# Patient Record
Sex: Female | Born: 2000 | Race: Asian | Hispanic: No | Smoking: Never smoker
Health system: Southern US, Community
[De-identification: ages and names within clinical notes are randomized; demographics above are authoritative.]

## PROBLEM LIST (undated history)

## (undated) DIAGNOSIS — T300 Burn of unspecified body region, unspecified degree: Secondary | ICD-10-CM

## (undated) HISTORY — DX: Burn of unspecified body region, unspecified degree: T30.0

## (undated) HISTORY — PX: BURN DEBRIDEMENT SURGERY: SHX582

---
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
## 2023-07-27 NOTE — Progress Notes (Signed)
 Subjective Patient ID: Melinda Garrison is a 23 y.o. female.  Chief Complaint  Patient presents with  . Abdominal Pain    Patient states she has been experiencing intense abdominal pain for the past 5 hours. Patient denies nausea and vomiting.     The following information was reviewed by members of the visit team:  Tobacco  Allergies  Meds  OB Status      23 year old female presents for evaluation of intense abdominal pain which began approximately 5 hours ago.  Pain is located in mid abdomen and face diffusely.  Reports associated nausea but no vomiting.  Denies associated back or flank pain.  No dysuria, frequency or hematuria.  States she has had intermittent symptoms over the past several weeks.  Symptoms typically resolve without intervention.  Current episode is more intense than previous episodes.  States sometimes symptoms are worse after eating and or exacerbated by food.  At times she will feel burning sensation in her throat as well.  No associated chest pain or discomfort no shortness of breath.  No fever, chills, cough, congestion or other recent illness.  Patient rates pain as 8 or 9 out of 10.    Review of Systems  Constitutional:  Negative for chills and fever.  Respiratory:  Negative for cough and shortness of breath.   Cardiovascular:  Negative for chest pain.  Gastrointestinal:  Positive for abdominal pain and nausea. Negative for constipation, diarrhea and vomiting.  Genitourinary:  Negative for dysuria, flank pain, frequency and hematuria.  Musculoskeletal:  Negative for arthralgias, back pain, myalgias, neck pain and neck stiffness.  Skin:  Negative for rash.  Neurological:  Negative for dizziness and headaches.  Hematological:  Negative for adenopathy. Does not bruise/bleed easily.    Objective Physical Exam Vitals reviewed.  Constitutional:      General: She is not in acute distress.    Appearance: Normal appearance. She is not ill-appearing,  toxic-appearing or diaphoretic.   Cardiovascular:     Rate and Rhythm: Normal rate and regular rhythm.     Pulses: Normal pulses.  Pulmonary:     Effort: Pulmonary effort is normal. No respiratory distress.     Breath sounds: Normal breath sounds.  Abdominal:     General: Abdomen is flat. There is no distension.     Palpations: Abdomen is soft.     Tenderness: There is abdominal tenderness (mild RUQ and epigastrium). There is no right CVA tenderness, left CVA tenderness, guarding or rebound.   Musculoskeletal:     Cervical back: Normal range of motion and neck supple. No rigidity.   Skin:    General: Skin is warm and dry.     Findings: No rash.   Neurological:     Mental Status: She is alert and oriented to person, place, and time.   Psychiatric:        Mood and Affect: Mood normal.     Assessment/Plan 23 year old female with mid and upper abdominal pain for the past 5 hours DDx: Gallbladder disease, pancreatitis, ulcer disease, gastritis, GERD POC urinalysis is negative for infection. POC U pregnant also negative Patient does have mild tenderness to palpation in epigastrium and right upper quadrant. Symptoms and exam seem consistent with GERD/ulcer disease.  However as discussed with patient cannot fully rule out pancreatitis, gallbladder disease or other acute process given limitations of urgent care setting. She appears to be in no acute distress at present, vital signs are within normal limits, afebrile Prescription for Zofran for nausea,  omeprazole for presumed GERD/ulcer disease Recommend follow-up with PCP, patient states she has an appointment in 4 days. Patient also instructed to present to the emergency department for further evaluation should she develop worsening abdominal pain, fever, vomiting or other worsening symptoms. Urgent Care Disposition:  Home Care    Electronically signed: Franky Floria Vannie DEVONNA 07/27/2023  10:32 PM

## 2023-07-31 NOTE — Progress Notes (Signed)
 Patient: Melinda Garrison  DOB: Jul 15, 2000, age 23 y.o. MRN: [id number]  PCP: Lauraine Patient, PA  Assessment and Plan   1. Hashimoto thyroiditis  TSH   TSH    2. Epigastric pain  Hepatic Function Panel   CBC   H. Pylori Breath Test   famotidine (PEPCID) 20 mg tablet   H. Pylori Breath Test   CBC   Hepatic Function Panel      Assessment & Plan 1. Gastritis. - Symptoms suggest a possible gastritis or gallbladder issue. The pain is not associated with her menstrual cycle or hormonal fluctuations. - The most likely diagnosis is gastritis, which can be triggered by stress, spicy food, or other factors. - She has been advised to avoid smoking, caffeinated beverages, spicy foods, and greasy foods, and to consume bland foods instead. - A prescription for Pepcid has been provided. Liver function tests (LFTs) will be conducted to rule out gallbladder involvement. A complete blood count (CBC) will also be performed. An H. pylori test will be administered today. If there is no improvement in her condition, an ultrasound may be considered.  2. Hashimoto's thyroiditis. - She has been on Synthroid since 11/2022. - Synthroid has helped regulate her menstrual cycle, but she does not feel significantly better overall. - She has been informed that she will need to continue taking Synthroid indefinitely. - It is recommended that she takes Synthroid on an empty stomach, separate from other medications.  3. Health maintenance. - She is due for a Pap smear and is advised to schedule it when she feels ready. - Annual physical was completed in 11/2022.  Follow-up - The patient will follow up in 3 months.   Follow up in about 3 months (around 10/31/2023) for nurse visit for labs 5-6 days prior to recheck.   Risks, benefits, and alternatives of the medications and treatment plan prescribed today were discussed, and patient expressed understanding. Answered all questions and addressed all  concerns to the patient's satisfaction.  Plan follow-up as discussed or as needed if any worsening symptoms or change in condition.  Patient voiced understanding of the treatment plan and agreed to attempt to comply.  Designer, fashion/clothing may have been used to create parts of the visit note. Consent from the patient/caregiver was obtained prior to its use.  See after visit summary for patient specific instructions.    Subjective   Melinda Garrison is a 23 y.o. female who presents with:     Patient presents with  . Hypothyroidism due to Hashimoto thyroiditis  . Medication Management     Pt presents to clinic for medication management and abd pain.  5/11 started with abd pain - thought it was a virus but has had intermittent pain since then - sometimes she has to lay and rest for hours  Does not seem to be related to anything. Foods do not seem to affect it, no ETOH since the pain started. Stool has not been right since this started.  During an episode - upper abd then starts to affect lower abd - achy, mild nausea, feels like gurgling in stomach, does not feel like she has to burp more  She has been on zofran and omeprazole 20 - used it once and it did not help  Went to [facility] 6/23  ---------------------------------------------------------------- Computer generated note History of Present Illness The patient presents for a recheck of Hashimoto's thyroiditis and abdominal pain.  She has been experiencing abdominal pain since 06/14/2023, initially attributing it to  food consumption. The pain, described as achy, originates in the upper abdomen and radiates to the lower abdomen. She also reports mild nausea and a gurgling sensation in her stomach. Despite attempts to alleviate the pain through burping, the discomfort persists. She sought treatment at an urgent care facility on 07/27/2023, where she was prescribed Zofran for nausea and omeprazole for her stomach issues. However, these  medications did not provide relief even after an hour of administration. She has not been taking omeprazole consistently over the past four days. She reports that certain foods exacerbate her symptoms, but she has abstained from alcohol due to its potential impact on her condition. Her bowel movements have been irregular, alternating between diarrhea and constipation. She does not consume milk regularly. She last traveled to [place] in 04/2023.  She has been diagnosed with Hashimoto's thyroiditis and has been on Synthroid since 11/2022. She reports a noticeable improvement in her condition, including regular menstrual cycles.  She is sexually active and uses condoms for birth control.  SOCIAL HISTORY She does not smoke or vape. She drinks some alcohol but no drugs. She lives with her parents and sister. She is a Consulting civil engineer at [employer].  FAMILY HISTORY Her mother and father are healthy. She has one brother and one sister, both of whom are healthy.  History was obtained from patient.   Reviewed and updated this visit by provider: Tobacco  Allergies  Meds  Problems  Med Hx  Surg Hx  Fam Hx        Review of Systems  Gastrointestinal:  Positive for abdominal pain (epigastric). Negative for constipation and diarrhea.        Objective   Vitals:   07/31/23 0812  BP: 114/68  Patient Position: Sitting  Pulse: 75  Temp: 97.1 F (36.2 C)  TempSrc: Temporal  Height: 5' 2 (1.575 m)  Weight: 146 lb 9.6 oz (66.5 kg)  SpO2: 98%  BMI (Calculated): 26.8    Physical Exam Vitals and nursing note reviewed.  Constitutional:      Appearance: Normal appearance.  HENT:     Head: Normocephalic and atraumatic.     Right Ear: External ear normal.     Left Ear: External ear normal.     Nose: Nose normal.  Eyes:     Conjunctiva/sclera: Conjunctivae normal.  Cardiovascular:     Rate and Rhythm: Normal rate and regular rhythm.     Pulses: Normal pulses.     Heart  sounds: Normal heart sounds.  Musculoskeletal:     Cervical back: Normal range of motion.  Pulmonary:     Effort: Pulmonary effort is normal.     Breath sounds: Normal breath sounds.  Abdominal:     Tenderness: There is abdominal tenderness (epigastric TTP).  Skin:    General: Skin is warm and dry.  Neurological:     General: No focal deficit present.     Mental Status: She is alert and oriented to person, place, and time. Mental status is at baseline.  Psychiatric:        Mood and Affect: Mood normal.        Behavior: Behavior normal.        Thought Content: Thought content normal.        Judgment: Judgment normal.         Lauraine Allen DEVONNA [facility]: Lambert Rhonda W  07/31/2023

## 2023-08-28 ENCOUNTER — Other Ambulatory Visit: Payer: Self-pay

## 2023-08-28 ENCOUNTER — Emergency Department (HOSPITAL_BASED_OUTPATIENT_CLINIC_OR_DEPARTMENT_OTHER)

## 2023-08-28 ENCOUNTER — Emergency Department (HOSPITAL_BASED_OUTPATIENT_CLINIC_OR_DEPARTMENT_OTHER)
Admission: EM | Admit: 2023-08-28 | Discharge: 2023-08-28 | Disposition: A | Discharge status: Home / Self Care | Attending: Emergency Medicine | Admitting: Emergency Medicine

## 2023-08-28 DIAGNOSIS — R1032 Left lower quadrant pain: Secondary | ICD-10-CM | POA: Diagnosis not present

## 2023-08-28 DIAGNOSIS — R1030 Lower abdominal pain, unspecified: Secondary | ICD-10-CM

## 2023-08-28 DIAGNOSIS — R1031 Right lower quadrant pain: Secondary | ICD-10-CM | POA: Diagnosis present

## 2023-08-28 DIAGNOSIS — R1084 Generalized abdominal pain: Secondary | ICD-10-CM | POA: Diagnosis not present

## 2023-08-28 LAB — CBC WITH DIFFERENTIAL/PLATELET
Abs Immature Granulocytes: 0 K/uL (ref 0.00–0.07)
Basophils Absolute: 0 K/uL (ref 0.0–0.1)
Basophils Relative: 0 %
Eosinophils Absolute: 0.1 K/uL (ref 0.0–0.5)
Eosinophils Relative: 1 %
HCT: 43.9 % (ref 36.0–46.0)
Hemoglobin: 14.8 g/dL (ref 12.0–15.0)
Immature Granulocytes: 0 %
Lymphocytes Relative: 28 %
Lymphs Abs: 1.6 K/uL (ref 0.7–4.0)
MCH: 28.8 pg (ref 26.0–34.0)
MCHC: 33.7 g/dL (ref 30.0–36.0)
MCV: 85.6 fL (ref 80.0–100.0)
Monocytes Absolute: 0.3 K/uL (ref 0.1–1.0)
Monocytes Relative: 6 %
Neutro Abs: 3.9 K/uL (ref 1.7–7.7)
Neutrophils Relative %: 65 %
Platelets: 289 K/uL (ref 150–400)
RBC: 5.13 MIL/uL — ABNORMAL HIGH (ref 3.87–5.11)
RDW: 11.6 % (ref 11.5–15.5)
WBC: 5.9 K/uL (ref 4.0–10.5)
nRBC: 0 % (ref 0.0–0.2)

## 2023-08-28 LAB — URINALYSIS, ROUTINE W REFLEX MICROSCOPIC
Bacteria, UA: NONE SEEN
Bilirubin Urine: NEGATIVE
Glucose, UA: NEGATIVE mg/dL
Ketones, ur: NEGATIVE mg/dL
Leukocytes,Ua: NEGATIVE
Nitrite: NEGATIVE
Protein, ur: NEGATIVE mg/dL
Specific Gravity, Urine: 1.01 (ref 1.005–1.030)
pH: 6 (ref 5.0–8.0)

## 2023-08-28 LAB — COMPREHENSIVE METABOLIC PANEL WITH GFR
ALT: 14 U/L (ref 0–44)
AST: 19 U/L (ref 15–41)
Albumin: 4.6 g/dL (ref 3.5–5.0)
Alkaline Phosphatase: 83 U/L (ref 38–126)
Anion gap: 14 (ref 5–15)
BUN: 12 mg/dL (ref 6–20)
CO2: 22 mmol/L (ref 22–32)
Calcium: 9.6 mg/dL (ref 8.9–10.3)
Chloride: 104 mmol/L (ref 98–111)
Creatinine, Ser: 0.64 mg/dL (ref 0.44–1.00)
GFR, Estimated: >60 mL/min (ref >60)
Glucose, Bld: 141 mg/dL — ABNORMAL HIGH (ref 70–99)
Potassium: 3.6 mmol/L (ref 3.5–5.1)
Sodium: 139 mmol/L (ref 135–145)
Total Bilirubin: 0.3 mg/dL (ref 0.0–1.2)
Total Protein: 7.4 g/dL (ref 6.5–8.1)

## 2023-08-28 LAB — LIPASE, BLOOD: Lipase: 23 U/L (ref 11–51)

## 2023-08-28 LAB — PREGNANCY, URINE: Preg Test, Ur: NEGATIVE

## 2023-08-28 LAB — OCCULT BLOOD X 1 CARD TO LAB, STOOL: Fecal Occult Bld: NEGATIVE

## 2023-08-28 MED ORDER — IOHEXOL 300 MG/ML  SOLN
100.0000 mL | Freq: Once | INTRAMUSCULAR | Status: AC | PRN
  Administered 2023-08-28: 100 mL via INTRAVENOUS
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
## 2023-08-28 NOTE — ED Notes (Signed)
 Patient transported to CT

## 2023-08-28 NOTE — ED Provider Notes (Signed)
 [facility] Provider Note   CSN: [id number] Arrival date & time: 08/28/23  1309     Patient presents with: Abdominal Pain   Melinda Garrison is a 23 y.o. female.   Abdominal Pain  Patient is a 23 year old female presents the ED today for concerns for generalized abdominal pain that localizes to her lower abdomen that started last night acutely noting that she had also had 1 episode of black stools.  Previous history of H. pylori +20-month ago and finished tetracycline/bismuth/Protonix/metronidazole 2 weeks ago.  Reported that black stools while she was taking the medications were common but that they had stopped after finishing medication course but returning for 1 episode last night. Endorses diarrhea that has been persistent since she has started the antibiotics.  Notes that her last menstrual period ended 2 days ago.  Denies fever, headache, vision changes, chest pain, shortness of breath, nausea, vomiting, hematochezia, hematuria, dysuria, vaginal discharge, vaginal pain, lower leg swelling.  Denies alcohol use, chronic NSAID use, IVD use, drug use.    Prior to Admission medications   Not on File    Allergies: Patient has no known allergies.    Review of Systems  Gastrointestinal:  Positive for abdominal pain.    Updated Vital Signs BP 114/67   Pulse 73   Temp 98.6 F (37 C) (Oral)   Resp 14   SpO2 98%   Physical Exam Vitals and nursing note reviewed.  Constitutional:      General: She is not in acute distress.    Appearance: Normal appearance. She is not ill-appearing or diaphoretic.  HENT:     Head: Normocephalic and atraumatic.  Eyes:     General: No scleral icterus.       Right eye: No discharge.        Left eye: No discharge.     Extraocular Movements: Extraocular movements intact.     Conjunctiva/sclera: Conjunctivae normal.     Pupils: Pupils are equal, round, and reactive to light.  Cardiovascular:      Rate and Rhythm: Normal rate and regular rhythm.     Pulses: Normal pulses.     Heart sounds: Normal heart sounds. No murmur heard.    No friction rub. No gallop.  Pulmonary:     Effort: Pulmonary effort is normal. No respiratory distress.     Breath sounds: Normal breath sounds. No stridor. No wheezing, rhonchi or rales.  Chest:     Chest wall: No tenderness.  Abdominal:     General: Abdomen is flat.     Palpations: Abdomen is soft. There is no pulsatile mass.     Tenderness: There is abdominal tenderness in the right lower quadrant, suprapubic area and left lower quadrant. There is no right CVA tenderness, left CVA tenderness, guarding or rebound. Negative signs include Murphy's sign, Rovsing's sign and McBurney's sign.     Hernia: No hernia is present.  Musculoskeletal:     Cervical back: Normal range of motion and neck supple. No rigidity or tenderness.     Right lower leg: No edema.     Left lower leg: No edema.  Skin:    General: Skin is warm and dry.     Coloration: Skin is not jaundiced.     Findings: No bruising, erythema, lesion or rash.  Neurological:     General: No focal deficit present.     Mental Status: She is alert and oriented to person, place, and time. Mental status  is at baseline.     Sensory: No sensory deficit.     Motor: No weakness.     Gait: Gait normal.  Psychiatric:        Mood and Affect: Mood normal.     (all labs ordered are listed, but only abnormal results are displayed) Labs Reviewed  CBC WITH DIFFERENTIAL/PLATELET - Abnormal; Notable for the following components:      Result Value   RBC 5.13 (*)    All other components within normal limits  COMPREHENSIVE METABOLIC PANEL WITH GFR - Abnormal; Notable for the following components:   Glucose, Bld 141 (*)    All other components within normal limits  URINALYSIS, ROUTINE W REFLEX MICROSCOPIC - Abnormal; Notable for the following components:   Hgb urine dipstick SMALL (*)    All other  components within normal limits  LIPASE, BLOOD  OCCULT BLOOD X 1 CARD TO LAB, STOOL  PREGNANCY, URINE    EKG: None  Radiology: CT ABDOMEN PELVIS W CONTRAST Result Date: 08/28/2023 CLINICAL DATA:  LLQ abdominal pain.  RLQ abdominal pain. EXAM: CT ABDOMEN AND PELVIS WITH CONTRAST TECHNIQUE: Multidetector CT imaging of the abdomen and pelvis was performed using the standard protocol following bolus administration of intravenous contrast. RADIATION DOSE REDUCTION: This exam was performed according to the departmental dose-optimization program which includes automated exposure control, adjustment of the mA and/or kV according to patient size and/or use of iterative reconstruction technique. CONTRAST:  100mL OMNIPAQUE  IOHEXOL  300 MG/ML  SOLN COMPARISON:  None Available. FINDINGS: Lower chest: The lung bases are clear. No pleural effusion. The heart is normal in size. No pericardial effusion. Hepatobiliary: The liver is normal in size. Non-cirrhotic configuration. No suspicious mass. No intrahepatic or extrahepatic bile duct dilation. No calcified gallstones. Normal gallbladder wall thickness. No pericholecystic inflammatory changes. Pancreas: Unremarkable. No pancreatic ductal dilatation or surrounding inflammatory changes. Spleen: Within normal limits. No focal lesion. Adrenals/Urinary Tract: Adrenal glands are unremarkable. No suspicious renal mass. No hydronephrosis. No renal or ureteric calculi. Unremarkable urinary bladder. Stomach/Bowel: No disproportionate dilation of the small or large bowel loops. No evidence of abnormal bowel wall thickening or inflammatory changes. The appendix is unremarkable. Vascular/Lymphatic: No ascites or pneumoperitoneum. No abdominal or pelvic lymphadenopathy, by size criteria. No aneurysmal dilation of the major abdominal arteries. Reproductive: The uterus is unremarkable. The ovaries are unremarkable. Other: The visualized soft tissues and abdominal wall are unremarkable.  Musculoskeletal: No suspicious osseous lesions. IMPRESSION: *No acute inflammatory process identified within the abdomen or pelvis. Unremarkable appendix. Electronically Signed   By: Ree Molt M.D.   On: 08/28/2023 15:47    Procedures   Medications Ordered in the ED  iohexol  (OMNIPAQUE ) 300 MG/ML solution 100 mL (100 mLs Intravenous Contrast Given 08/28/23 1526)                                  Medical Decision Making Amount and/or Complexity of Data Reviewed Labs: ordered. Radiology: ordered.  Risk Prescription drug management.   This patient is a 23 year old female who presents to the ED for concern of lower abdominal pain starting last night accompanied with 1 episode of black stooling was after being recently treated for H. pylori with tetracycline, metronidazole, bismuth, Protonix.  Reports that the pain subsided since she is experienced it last night.  But still present at this time.  Denies any vaginal complaints.   On physical exam, patient is in no acute  distress, afebrile, alert and orient x 4, speaking in full sentences, nontachypneic, nontachycardic.  LCTAB, RRR, no murmur, mild abdominal tenderness noted generally, localizing to both the right and left lower quadrants.  No rebound tenderness, no guarding. No CVA tenderness bilaterally.  No rashes/lesions/erythema.  No lower leg edema.  No gross blood noted in the rectal vault as well as around the vulva.  No tenderness or lesions noted.  Unremarkable exam otherwise.  Labs at this time show patient near baseline, noting some hemoglobin in her urine but otherwise unremarkable with no blood noted in fecal occult testing.  Shared decision making with patient regarding CT scan with patient requesting 1 at this time for the lower abdominal pain considering that she has had persistent 6 out of 10 pain since the more painful episode this morning.  Labs and imaging unremarkable.  Have her follow-up with PCP For further evaluation.   Return to the ED for any new or worsening symptoms.  Continue with already prescribed medications from PCP.  Patient vital signs have remained stable throughout the course of patient's time in the ED. Low suspicion for any other emergent pathology at this time. I believe this patient is safe to be discharged. Provided strict return to ER precautions. Patient expressed agreement and understanding of plan. All questions were answered.  Differential diagnoses prior to evaluation: The emergent differential diagnosis includes, but is not limited to, antibiotic associated colitis, gastroenteritis, PUD, GERD, appendicitis, diverticulitis, ovarian cyst, IBS, IBD, ovarian torsion, ectopic pregnancy, PID, UTI, nephrolithiasis. This is not an exhaustive differential.   Past Medical History / Co-morbidities / Social History: H. pylori positive  Additional history: Chart reviewed. Pertinent results include:   Last seen by primary care on 07/31/2023 for Hashimoto's thyroiditis.  She was complaining of gastritis symptoms at that time.  Provided Pepcid.  With H. pylori test done at that time.   Lab Tests/Imaging studies: I personally interpreted labs/imaging and the pertinent results include:   CBC unremarkable CMP unremarkable UA shows small hemoglobin otherwise unremarkable Lipase unremarkable Pregnancy test negative Fecal occult negative CT abdomen unremarkable   I agree with the radiologist interpretation.  Medications: I have reviewed the patients home medicines and have made adjustments as needed.  Critical Interventions: None  Social Determinants of Health: Has good follow-up with PCP  Disposition: After consideration of the diagnostic results and the patients response to treatment, I feel that the patient would benefit from discharge and treatment as above.   emergency department workup does not suggest an emergent condition requiring admission or immediate intervention beyond what has  been performed at this time. The plan is: Follow-up with PCP, continue taking medications as prescribed, return to the ED for any new or worsening symptoms. The patient is safe for discharge and has been instructed to return immediately for worsening symptoms, change in symptoms or any other concerns.   Final diagnoses:  Lower abdominal pain    ED Discharge Orders     None          Melinda Garrison GORMAN DEVONNA 08/28/23 1612    Mannie Pac T, DO 08/29/23 [phone]

## 2023-08-28 NOTE — Discharge Instructions (Signed)
 You are seen today for lower abdominal pain.  Your imaging and lab work and physical Sam today were all reassuring that I low suspicion for any emergent cause your symptoms today.  Recommend continue to follow-up with your PCP for further evaluation.  Please return to the ED for new or worsening symptoms for include fever, chest pain, shortness of breath, blood in stool.  Continue taking medications as prescribed until told otherwise by your PCP.
